# Patient Record
Sex: Female | Born: 1975 | Race: White | Hispanic: No | Marital: Single
Health system: Southern US, Community
[De-identification: ages and names within clinical notes are randomized; demographics above are authoritative.]

---

## 2009-04-09 ENCOUNTER — Emergency Department: Payer: Self-pay | Admitting: Internal Medicine

## 2009-04-10 ENCOUNTER — Ambulatory Visit: Payer: Self-pay | Admitting: Vascular Surgery

## 2009-04-11 ENCOUNTER — Ambulatory Visit: Payer: Self-pay | Admitting: Vascular Surgery

## 2010-03-26 IMAGING — US ABDOMEN ULTRASOUND
1 series · 17 of 25 positions shown · non-contrast
Comparison: none

REASON FOR EXAM: abd pain
COMMENTS:

[Series 1: abdomen ultrasound · 17 of 64 slices shown]
[im 1/64]
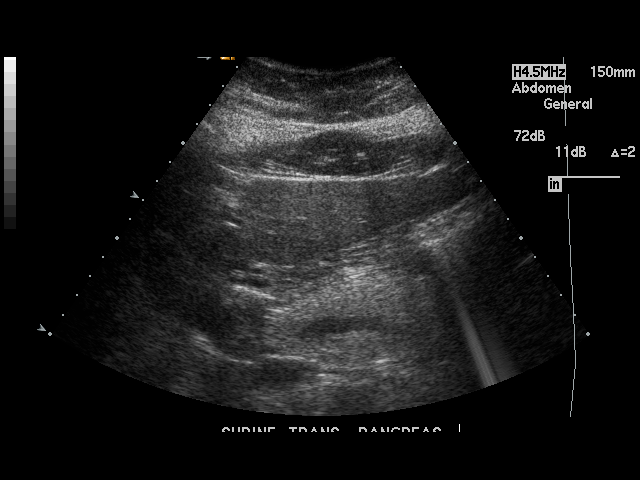
[im 6/64]
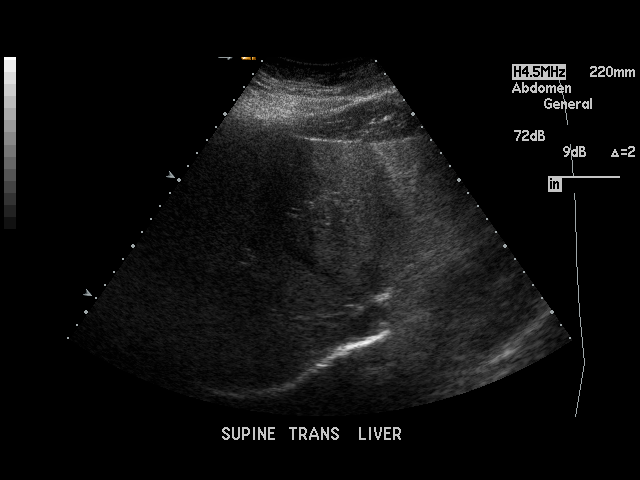
[im 8/64]
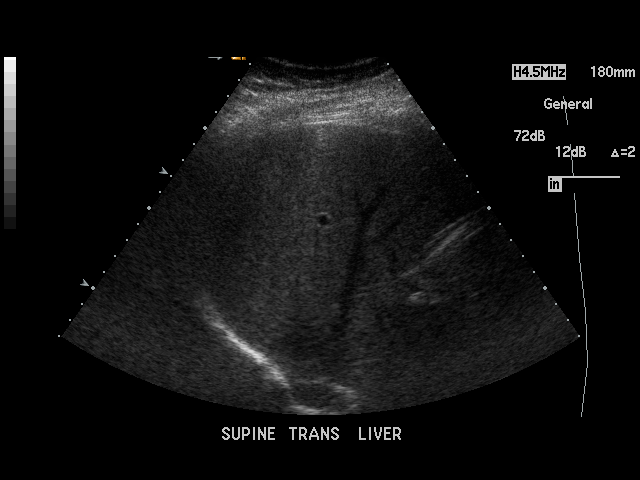
[im 14/64]
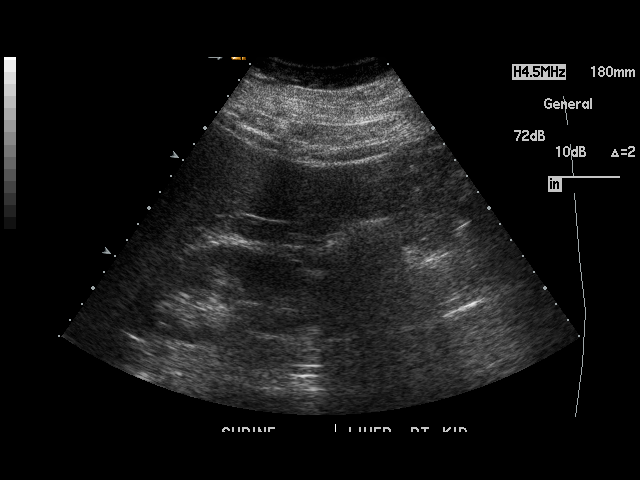
[im 16/64]
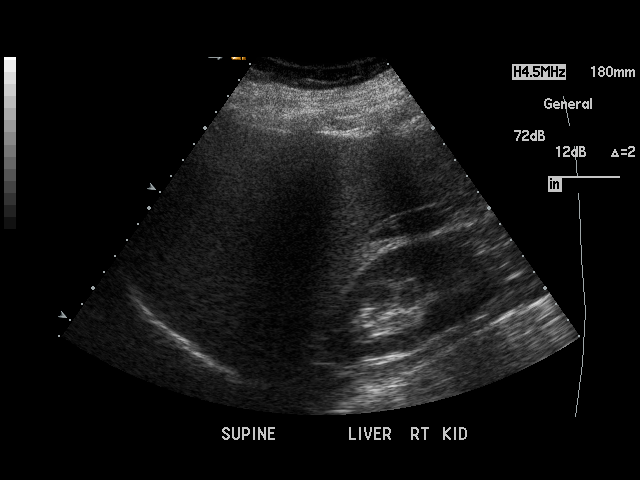
[im 22/64]
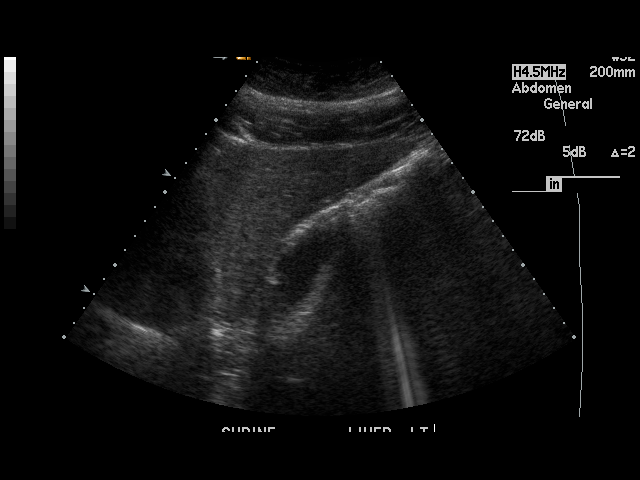
[im 24/64]
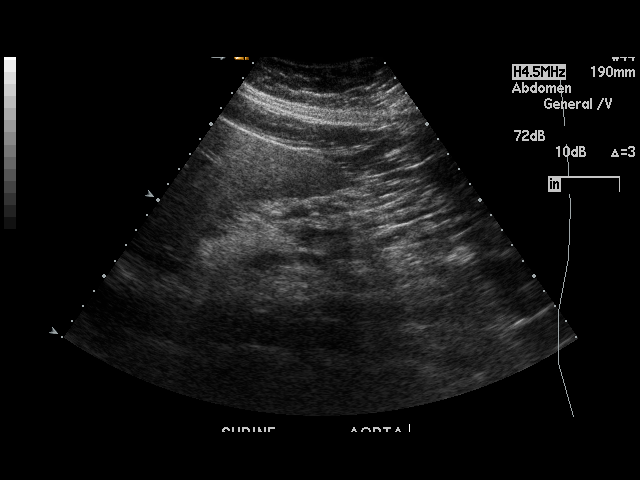
[im 29/64]
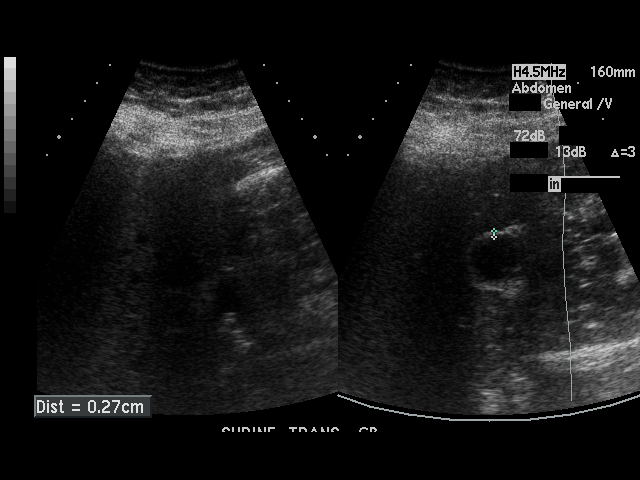
[im 32/64]
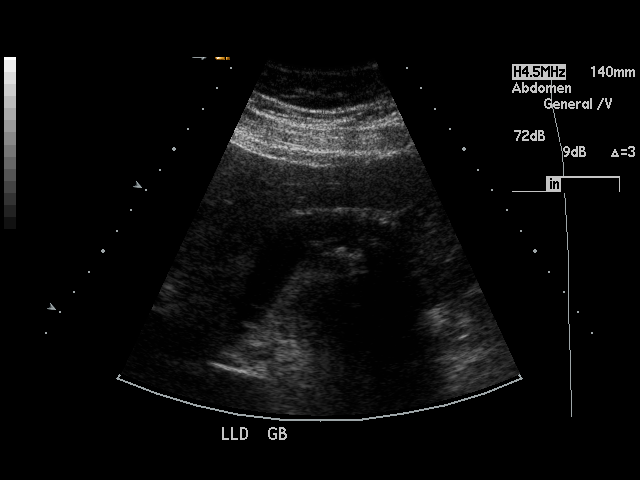
[im 35/64]
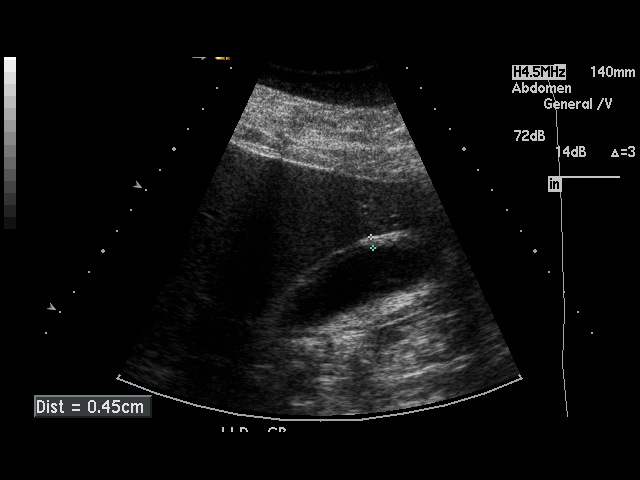
[im 40/64]
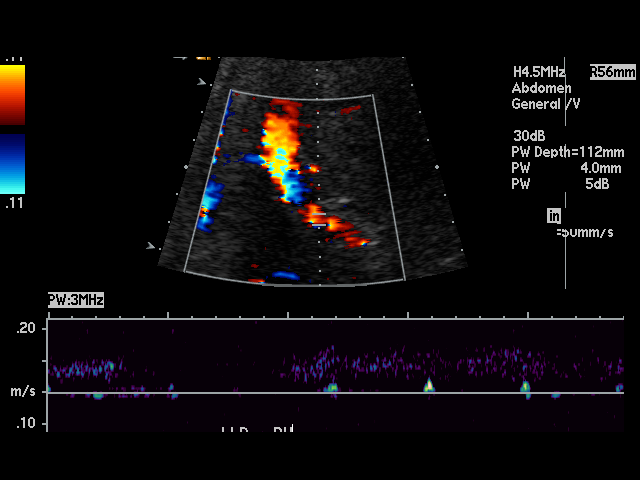
[im 43/64]
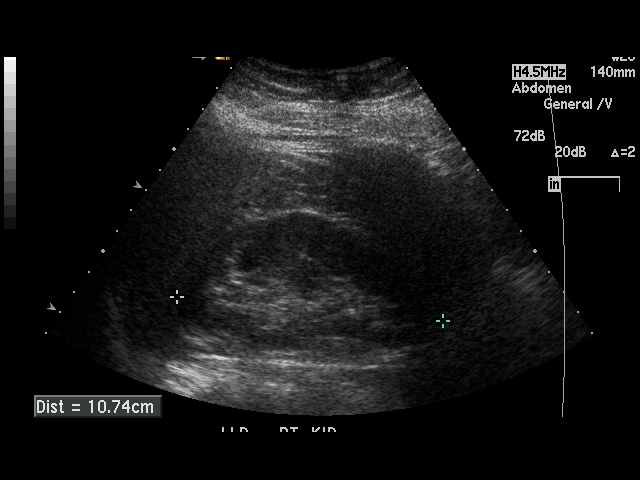
[im 48/64]
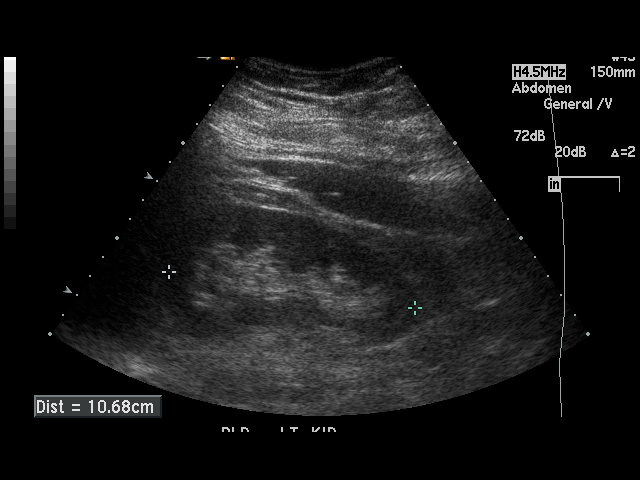
[im 50/64]
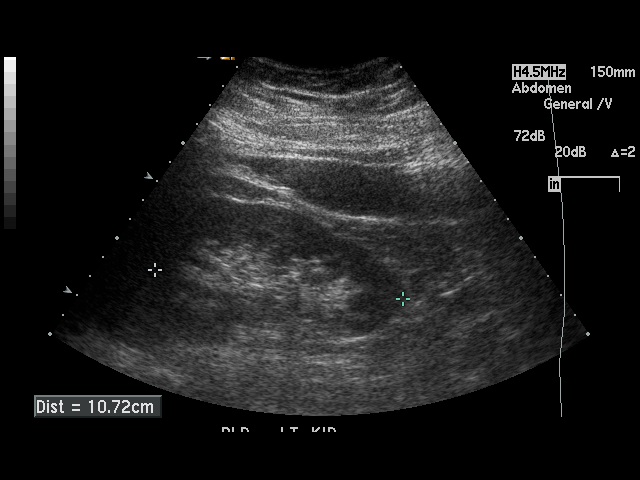
[im 56/64]
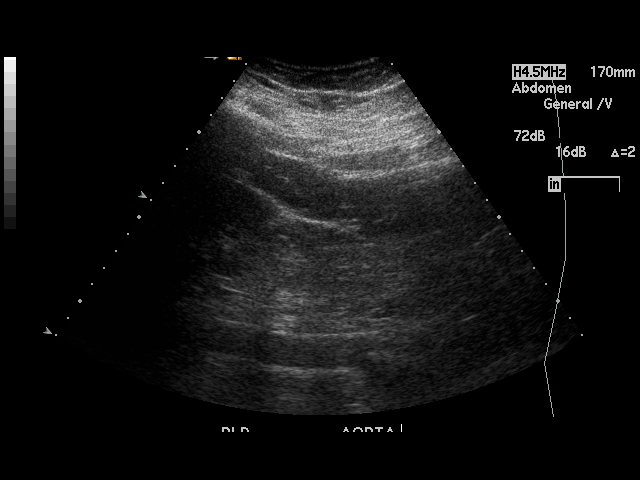
[im 58/64]
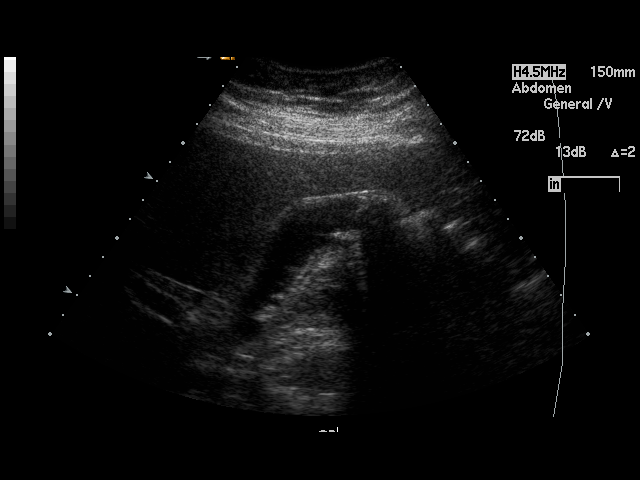
[im 64/64]
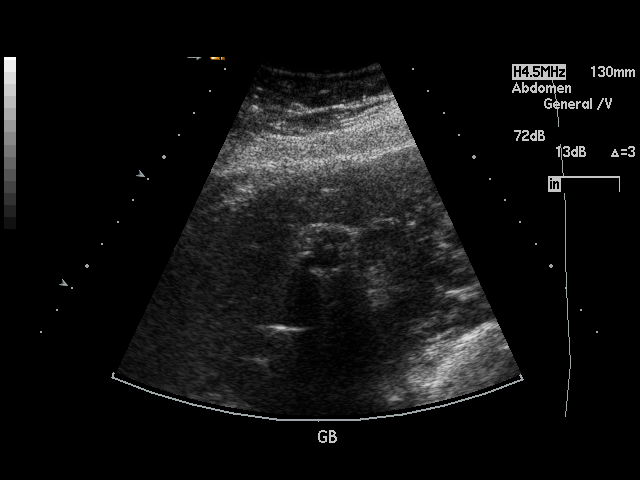

[17 of 25 positions shown; findings below may reference images not displayed]

PROCEDURE:     US  - US ABDOMEN GENERAL SURVEY  - April 09, 2009 [DATE]

RESULT:     The liver, spleen, pancreas, abdominal aorta and inferior vena
cava show no significant abnormalities. There are multiple, mobile
echodensities in the gallbladder compatible with gallstones. There is mild
thickening of the gallbladder wall which measures 4.5 mm in thickness. The
common bile duct is normal in size and is noted to measure variably between
4.0 and 4.5 mm at maximum diameter. The kidneys show no hydronephrosis.
There is no ascites.
IMPRESSION: 1.  Cholelithiasis.
2.  There is mild thickening of the gallbladder wall which would raise the
question of cholecystitis.

## 2013-07-03 ENCOUNTER — Ambulatory Visit: Payer: Self-pay | Admitting: Unknown Physician Specialty

## 2013-07-10 ENCOUNTER — Ambulatory Visit: Payer: Self-pay | Admitting: Unknown Physician Specialty

## 2013-07-12 LAB — PATHOLOGY REPORT

## 2013-07-31 LAB — CULTURE, FUNGUS WITHOUT SMEAR

## 2013-08-08 LAB — WOUND CULTURE

## 2014-10-12 NOTE — Op Note (Signed)
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   PROCEDURES PERFORMED: 1.  Use of Stryker navigation system.  2.  Bilateral endoscopic maxillary antrostomy with removal of tissue.  3.  Bilateral endoscopic anterior and posterior ethmoidectomy with removal of tissue.  4.  Nasal septoplasty.  5.  Bilateral submucous resection of inferior turbinates.   SURGEON: Davina Poke, M.D.   OPERATIVE FINDINGS: Left septal deviation, bilateral inferior turbinate hypertrophy, polypoid disease throughout the ethmoid and maxillary sinuses, mucopus and mucopurulent debris in the base of the left maxillary sinus.   DESCRIPTION OF PROCEDURE: Rigby was identified in the holding, taken to the operating room and placed in the spine position. After general endotracheal anesthesia, the table was turned 90 degrees. The Stryker navigation device was applied and calibrated and remained on throughout the procedure. A topical anesthetic of phenylephrine lidocaine was placed within each nostril. After approximately 5 minutes this was removed. A local anesthetic of 1% lidocaine with 1:100,000 epinephrine was used to inject the nasal septum, the inferior turbinates, and the lateral nasal wall. A total of 14 mL was used. With this completed, the patient was draped in the usual fashion for endoscopic sinus surgery. Due to severe left septal deviation, the operation began with a nasal septoplasty. A hemitransfixion incision was created on the leading edge of the septum, on the right hand side. A subperichondrial plane was elevated posteriorly on the left back to the perpendicular plate of  the ethmoid where a subperiosteal plane was elevated. An inferior rim of cartilage was removed and the quadrangular cartilage was separated from the perpendicular plate of the ethmoid. A subperichondrial plane was elevated posteriorly on the right. The septal deviation and septal spur were removed and that allowed the septal cartilage and quadrangular cartilage to swing back into the midline giving excellent reduction of the deviation. With this completed, a posterior/inferior fenestration was created in the septum to allow hematoma drainage.   With the septoplasty completed, the operation then proceeded with maxillary antrostomy. Beginning on the left-hand side, the 0 degree endoscope was introduced into the nose along with the Stryker navigation device. The middle turbinate was gently medialized. The uncinate process was identified. This was incised using the Cottle elevator. A curved suction was then placed into the maxillary sinus. There was mucopus which was suctioned free and sent for culture. The straight and side-biting forceps were used to open the antrostomy widely opening into the maxillary sinus. There was polypoid disease which was removed. With the maxillary sinus opened, the operation then turned to the ethmoidectomy. The ethmoid bulla was identified. This was opened. There was mucopurulent debris and polypoid disease in the ethmoid cavity which was removed. The anterior and posterior ethmoid cells were opened. The small bony fragments surrounding each cell were opened and removed. This gave excellent opening to the ethmoid cavity. This was taken back to the posterior ethmoid and the sphenoid sinus was also opened on that side as this was part of the posterior ethmoid. With this completed, the right side was examined. Again, the middle turbinate was gently medialized. The uncinate process was taken down using the Cottle elevator. The curved suction was then placed into the sinus. There was no pus  in  the right. There was polypoid disease which was removed. The straight and side-biting forceps were then used to open the antrostomy widely on the right. Again, the ethmoid bulla on the right was identified. This was opened and the anterior/posterior ethmoid cells were cleared. There was polypoid mucosa in the sinuses as well. Again, this was taken back to the posterior ethmoid and the sphenoid was also opened on the right. There was polypoid disease throughout. With the anterior and posterior ethmoids open on the right, as well as maxillary, the left side was re-examined. There was no bleeding. The ethmoid and maxillary sinuses  were widely patent. With this completed, the operation then turned to the inferior turbinates. Beginning on the left-hand side, a 15 blade was used to incise along the inferior aspect of the inferior turbinate to the conchal bone. A superolaterally based flap was elevated. The Knight scissors were then used to excise the conchal bone and underlying mucosa. The superolaterally based flap was laid back over the conchal stump. This was then cauterized using suction cautery. In a similar fashion, the inferior turbinate on the right had a submucous resection performed. With no active bleeding, the hemitransfixion incision was closed using 3-0 chromic. Standard female septal splints were placed against each side of the septum and affixed to the septum using a 3-0 nylon. Stammberger gel was then placed within the ethmoid and maxillary cavities as well as beneath the inferior turbinates. The patient was then returned to anesthesia where she was awakened in the operating room and taken to the recovery room in stable condition.   CULTURES: Left maxillary sinus.   SPECIMENS: Maxillary and ethmoid sinuses.   ESTIMATED BLOOD LOSS: 30 mL. ____________________________ Davina Pokehapman T. Myasia Sinatra, MD [email or web address] D: 07/10/2013 10:19:45 ET T: 07/10/2013 12:34:09 ET JOB#: [id number]  cc: Davina Pokehapman T. Yuriana Gaal, MD,  <Dictator> Davina PokeHAPMAN T Tacie Mccuistion MD ELECTRONICALLY SIGNED 07/27/2013 15:11
